# Patient Record
Sex: Male | Born: 1970 | Race: White | Hispanic: No | Marital: Single | Smoking: Current every day smoker
Health system: Southern US, Community
[De-identification: ages and names within clinical notes are randomized; demographics above are authoritative.]

## PROBLEM LIST (undated history)

## (undated) DIAGNOSIS — B1920 Unspecified viral hepatitis C without hepatic coma: Secondary | ICD-10-CM

## (undated) DIAGNOSIS — K219 Gastro-esophageal reflux disease without esophagitis: Secondary | ICD-10-CM

## (undated) DIAGNOSIS — F1910 Other psychoactive substance abuse, uncomplicated: Secondary | ICD-10-CM

## (undated) HISTORY — DX: Other psychoactive substance abuse, uncomplicated: F19.10

## (undated) HISTORY — DX: Unspecified viral hepatitis C without hepatic coma: B19.20

## (undated) HISTORY — PX: FRACTURE SURGERY: SHX138

## (undated) HISTORY — DX: Gastro-esophageal reflux disease without esophagitis: K21.9

---

## 2005-07-24 ENCOUNTER — Emergency Department (HOSPITAL_COMMUNITY): Admission: EM | Admit: 2005-07-24 | Discharge: 2005-07-24 | Payer: Self-pay | Admitting: Emergency Medicine

## 2015-04-24 ENCOUNTER — Encounter (HOSPITAL_COMMUNITY): Payer: Self-pay | Admitting: Emergency Medicine

## 2015-04-24 ENCOUNTER — Emergency Department (HOSPITAL_COMMUNITY)
Admission: EM | Admit: 2015-04-24 | Discharge: 2015-04-24 | Disposition: A | Discharge status: Home / Self Care | Payer: Self-pay | Attending: Emergency Medicine | Admitting: Emergency Medicine

## 2015-04-24 ENCOUNTER — Emergency Department (HOSPITAL_COMMUNITY): Payer: Self-pay

## 2015-04-24 DIAGNOSIS — Y9389 Activity, other specified: Secondary | ICD-10-CM | POA: Insufficient documentation

## 2015-04-24 DIAGNOSIS — S0083XA Contusion of other part of head, initial encounter: Secondary | ICD-10-CM | POA: Insufficient documentation

## 2015-04-24 DIAGNOSIS — Y998 Other external cause status: Secondary | ICD-10-CM | POA: Insufficient documentation

## 2015-04-24 DIAGNOSIS — Y9289 Other specified places as the place of occurrence of the external cause: Secondary | ICD-10-CM | POA: Insufficient documentation

## 2015-04-24 DIAGNOSIS — S0990XA Unspecified injury of head, initial encounter: Secondary | ICD-10-CM | POA: Insufficient documentation

## 2015-04-24 DIAGNOSIS — Z720 Tobacco use: Secondary | ICD-10-CM | POA: Insufficient documentation
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
## 2015-04-24 NOTE — ED Notes (Signed)
Pt got up to walk out of triage and is unsteady on feet
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
## 2015-04-24 NOTE — ED Notes (Signed)
MD Cook at bedside. 
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
## 2018-03-21 ENCOUNTER — Encounter (HOSPITAL_COMMUNITY): Payer: Self-pay | Admitting: Emergency Medicine

## 2018-03-21 ENCOUNTER — Emergency Department (HOSPITAL_COMMUNITY)
Admission: EM | Admit: 2018-03-21 | Discharge: 2018-03-21 | Disposition: A | Discharge status: Home / Self Care | Payer: Self-pay | Attending: Emergency Medicine | Admitting: Emergency Medicine

## 2018-03-21 ENCOUNTER — Other Ambulatory Visit: Payer: Self-pay

## 2018-03-21 DIAGNOSIS — Z5321 Procedure and treatment not carried out due to patient leaving prior to being seen by health care provider: Secondary | ICD-10-CM | POA: Insufficient documentation

## 2018-03-21 DIAGNOSIS — R2231 Localized swelling, mass and lump, right upper limb: Secondary | ICD-10-CM | POA: Insufficient documentation

## 2018-03-21 DIAGNOSIS — F1010 Alcohol abuse, uncomplicated: Secondary | ICD-10-CM | POA: Insufficient documentation

## 2018-03-21 NOTE — ED Triage Notes (Addendum)
Pt states drinking beer nonstop x4 days.  Pt states "I want to commit myself"  Denies SI/HI.  Pt punched the wall last night, swelling and pain to right hand

## 2018-03-21 NOTE — ED Notes (Signed)
Pt not in waiting area x 3 

## 2018-05-15 NOTE — Congregational Nurse Program (Signed)
Congregational Nurse Program Note  Date of Encounter: 05/15/2018  Past Medical History: No past medical history on file.  Encounter Details: CNP Questionnaire - 05/03/18 1025      Questionnaire   Patient Status  Not Applicable    Race  White or Caucasian    Location Patient Served At  [facility] [place]    Insurance  Not Applicable    Uninsured  Uninsured (NEW 1x/quarter)    Food  No food insecurities    Housing/Utilities  Yes, have permanent housing    Transportation  No transportation needs    Interpersonal Safety  Yes, feel physically and emotionally safe where you currently live    Medication  No medication insecurities    Medical Provider  No    Referrals  Not Applicable    ED Visit Averted  Not Applicable    Life-Saving Intervention Made  Not Applicable     Visited food pantry at the [facility] [address] Yuriko Portales RN, [place] [address], [phone]

## 2018-06-21 ENCOUNTER — Emergency Department (HOSPITAL_COMMUNITY): Payer: Self-pay

## 2018-06-21 ENCOUNTER — Emergency Department (HOSPITAL_COMMUNITY)
Admission: EM | Admit: 2018-06-21 | Discharge: 2018-06-22 | Disposition: A | Discharge status: Home / Self Care | Payer: Self-pay | Attending: Emergency Medicine | Admitting: Emergency Medicine

## 2018-06-21 ENCOUNTER — Other Ambulatory Visit: Payer: Self-pay

## 2018-06-21 ENCOUNTER — Encounter (HOSPITAL_COMMUNITY): Payer: Self-pay | Admitting: Emergency Medicine

## 2018-06-21 DIAGNOSIS — F1910 Other psychoactive substance abuse, uncomplicated: Secondary | ICD-10-CM

## 2018-06-21 DIAGNOSIS — F1410 Cocaine abuse, uncomplicated: Secondary | ICD-10-CM | POA: Insufficient documentation

## 2018-06-21 DIAGNOSIS — R0789 Other chest pain: Secondary | ICD-10-CM

## 2018-06-21 DIAGNOSIS — F10920 Alcohol use, unspecified with intoxication, uncomplicated: Secondary | ICD-10-CM

## 2018-06-21 DIAGNOSIS — F17210 Nicotine dependence, cigarettes, uncomplicated: Secondary | ICD-10-CM | POA: Insufficient documentation

## 2018-06-21 DIAGNOSIS — R55 Syncope and collapse: Secondary | ICD-10-CM

## 2018-06-21 DIAGNOSIS — F10220 Alcohol dependence with intoxication, uncomplicated: Secondary | ICD-10-CM | POA: Insufficient documentation
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
## 2018-06-22 ENCOUNTER — Other Ambulatory Visit: Payer: Self-pay

## 2018-06-22 LAB — I-STAT TROPONIN, ED
Troponin i, poc: 0 ng/mL (ref 0.00–0.08)
Troponin i, poc: 0 ng/mL (ref 0.00–0.08)
Troponin i, poc: 0 ng/mL (ref 0.00–0.08)

## 2018-06-22 LAB — CBC WITH DIFFERENTIAL/PLATELET
Basophils Absolute: 0 10*3/uL (ref 0.0–0.1)
Basophils Relative: 0 %
EOS ABS: 0.3 10*3/uL (ref 0.0–0.7)
EOS PCT: 4 %
HCT: 41.4 % (ref 39.0–52.0)
Hemoglobin: 14 g/dL (ref 13.0–17.0)
Lymphocytes Relative: 27 %
Lymphs Abs: 1.5 10*3/uL (ref 0.7–4.0)
MCH: 34.1 pg — AB (ref 26.0–34.0)
MCHC: 33.8 g/dL (ref 30.0–36.0)
MCV: 100.7 fL — ABNORMAL HIGH (ref 78.0–100.0)
MONOS PCT: 11 %
Monocytes Absolute: 0.6 10*3/uL (ref 0.1–1.0)
Neutro Abs: 3.2 10*3/uL (ref 1.7–7.7)
Neutrophils Relative %: 58 %
PLATELETS: 155 10*3/uL (ref 150–400)
RBC: 4.11 MIL/uL — ABNORMAL LOW (ref 4.22–5.81)
RDW: 14.5 % (ref 11.5–15.5)
WBC: 5.6 10*3/uL (ref 4.0–10.5)

## 2018-06-22 LAB — COMPREHENSIVE METABOLIC PANEL
ALT: 34 U/L (ref 0–44)
ANION GAP: 11 (ref 5–15)
AST: 40 U/L (ref 15–41)
Albumin: 3.6 g/dL (ref 3.5–5.0)
Alkaline Phosphatase: 65 U/L (ref 38–126)
BUN: 12 mg/dL (ref 6–20)
CO2: 23 mmol/L (ref 22–32)
Calcium: 8.6 mg/dL — ABNORMAL LOW (ref 8.9–10.3)
Chloride: 105 mmol/L (ref 98–111)
Creatinine, Ser: 0.92 mg/dL (ref 0.61–1.24)
GFR calc non Af Amer: >60 mL/min (ref >60)
Glucose, Bld: 122 mg/dL — ABNORMAL HIGH (ref 70–99)
Potassium: 3.3 mmol/L — ABNORMAL LOW (ref 3.5–5.1)
SODIUM: 139 mmol/L (ref 135–145)
Total Bilirubin: 0.8 mg/dL (ref 0.3–1.2)
Total Protein: 6.8 g/dL (ref 6.5–8.1)

## 2018-06-22 LAB — RAPID URINE DRUG SCREEN, HOSP PERFORMED
Amphetamines: NOT DETECTED
BARBITURATES: NOT DETECTED
BENZODIAZEPINES: NOT DETECTED
COCAINE: POSITIVE — AB
OPIATES: NOT DETECTED
TETRAHYDROCANNABINOL: POSITIVE — AB

## 2018-06-22 LAB — ETHANOL: ALCOHOL ETHYL (B): 137 mg/dL — AB (ref <10)

## 2018-06-22 MED ORDER — ASPIRIN 81 MG PO CHEW
324.0000 mg | CHEWABLE_TABLET | Freq: Once | ORAL | Status: AC
  Administered 2018-06-22: 324 mg via ORAL
  Filled 2018-06-22: qty 4

## 2018-06-22 MED ORDER — NITROGLYCERIN 2 % TD OINT
1.0000 [in_us] | TOPICAL_OINTMENT | Freq: Once | TRANSDERMAL | Status: AC
  Administered 2018-06-22: 1 [in_us] via TOPICAL
  Filled 2018-06-22: qty 1

## 2018-06-22 NOTE — Discharge Instructions (Addendum)
He was seen today for chest pain and syncope.  Your work-up is largely reassuring.  Your heart testing is negative.  You need to stop smoking.  You should also discontinue cocaine use as this puts you at high risk.  Follow-up with cardiology for stress testing.  If you develop recurrent pain, any new or worsening symptoms you should be reevaluated.
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
## 2018-06-22 NOTE — ED Notes (Signed)
Pt ambulatory to waiting room. Pt verbalized understanding of discharge instructions.   

## 2019-02-28 ENCOUNTER — Other Ambulatory Visit: Payer: Self-pay

## 2019-02-28 ENCOUNTER — Ambulatory Visit: Payer: Self-pay | Admitting: Physician Assistant

## 2019-02-28 ENCOUNTER — Encounter: Payer: Self-pay | Admitting: Physician Assistant

## 2019-02-28 VITALS — BP 106/66 | HR 87 | Temp 98.2°F

## 2019-02-28 DIAGNOSIS — F17200 Nicotine dependence, unspecified, uncomplicated: Secondary | ICD-10-CM | POA: Insufficient documentation

## 2019-02-28 DIAGNOSIS — Z7689 Persons encountering health services in other specified circumstances: Secondary | ICD-10-CM

## 2019-02-28 DIAGNOSIS — F1911 Other psychoactive substance abuse, in remission: Secondary | ICD-10-CM

## 2019-02-28 DIAGNOSIS — Z8619 Personal history of other infectious and parasitic diseases: Secondary | ICD-10-CM

## 2019-02-28 DIAGNOSIS — K219 Gastro-esophageal reflux disease without esophagitis: Secondary | ICD-10-CM | POA: Insufficient documentation

## 2019-02-28 MED ORDER — OMEPRAZOLE 40 MG PO CPDR: 40.0000 mg | DELAYED_RELEASE_CAPSULE | Freq: Every day | ORAL | 3 refills | Status: AC

## 2019-02-28 MED ORDER — OMEPRAZOLE 40 MG PO CPDR: 40.0000 mg | DELAYED_RELEASE_CAPSULE | Freq: Every day | ORAL | 3 refills | Status: DC

## 2019-02-28 NOTE — Patient Instructions (Signed)
For [facility] please bring 1040 tax forms for 2019

## 2019-02-28 NOTE — Progress Notes (Signed)
BP 106/66   Pulse 87   Temp 98.2 F (36.8 C)   SpO2 97%    Subjective:    Patient ID: Brendan Cruz, male    DOB: 06-Oct-1971, 48 y.o.   MRN: [id number]  HPI: Brendan GivenRoger L Cruz is a 48 y.o. male presenting on 02/28/2019 for New Patient (Initial Visit) and Gastroesophageal Reflux   HPI   Chief Complaint  Patient presents with  . New Patient (Initial Visit)  . Gastroesophageal Reflux     [facility] .  He says it is going well so far.   [facility] [employer] [facility] [employer] [facility]   Pt just got out of jail- April 9-    He says it was Probation vialation  (he went in in august)  He says 2- 20mg  omeprazole work for his GERD.    Pt was told in jail that he had hep C.  He has no complaints today.     Relevant past medical, surgical, family and social history reviewed and updated as indicated. Interim medical history since our last visit reviewed. Allergies and medications reviewed and updated.   Current Outpatient Medications:  .  omeprazole (PRILOSEC) 20 MG capsule, Take 20 mg by mouth 2 (two) times a day., Disp: , Rfl:     Review of Systems  Per HPI unless specifically indicated above     Objective:    BP 106/66   Pulse 87   Temp 98.2 F (36.8 C)   SpO2 97%   Wt Readings from Last 3 Encounters:  03/21/18 157 lb 7 oz (71.4 kg)  04/24/15 165 lb (74.8 kg)    Physical Exam Vitals signs reviewed.  Constitutional:      Appearance: He is well-developed.  HENT:     Head: Normocephalic and atraumatic.     Mouth/Throat:     Pharynx: No oropharyngeal exudate.  Eyes:     Conjunctiva/sclera: Conjunctivae normal.     Pupils: Pupils are equal, round, and reactive to light.  Neck:     Musculoskeletal: Neck supple.     Thyroid: No thyromegaly.  Cardiovascular:     Rate and Rhythm: Normal rate and regular rhythm.  Pulmonary:     Effort: Pulmonary effort is normal.     Breath sounds: Normal breath sounds. No wheezing or rales.  Abdominal:     General: Bowel  sounds are normal.     Palpations: Abdomen is soft. There is no mass.     Tenderness: There is no abdominal tenderness.  Lymphadenopathy:     Cervical: No cervical adenopathy.  Skin:    General: Skin is warm and dry.     Findings: No rash.  Neurological:     Mental Status: He is alert and oriented to person, place, and time.  Psychiatric:        Behavior: Behavior normal.        Thought Content: Thought content normal.          Assessment & Plan:    Encounter Diagnoses  Name Primary?  . Encounter to establish care Yes  . Gastroesophageal reflux disease, esophagitis presence not specified   . Substance abuse in remission (HCC)   . History of hepatitis C   . Tobacco use disorder    -will defer labs at this time due to CV19.  -pt is signed up for [facility] to get his omeprazole.   -pt to follow up in 3 months.  Will check for Hep  C at that time.  Pt to contact office sooner prn

## 2019-05-29 ENCOUNTER — Other Ambulatory Visit: Payer: Self-pay | Admitting: Physician Assistant

## 2019-05-29 DIAGNOSIS — Z8619 Personal history of other infectious and parasitic diseases: Secondary | ICD-10-CM

## 2019-05-29 DIAGNOSIS — Z13220 Encounter for screening for lipoid disorders: Secondary | ICD-10-CM

## 2019-05-29 DIAGNOSIS — Z125 Encounter for screening for malignant neoplasm of prostate: Secondary | ICD-10-CM

## 2019-05-30 ENCOUNTER — Ambulatory Visit: Payer: Self-pay | Admitting: Physician Assistant

## 2019-06-06 ENCOUNTER — Ambulatory Visit: Payer: Self-pay | Admitting: Physician Assistant

## 2019-06-06 ENCOUNTER — Encounter: Payer: Self-pay | Admitting: Physician Assistant

## 2019-06-06 DIAGNOSIS — F17200 Nicotine dependence, unspecified, uncomplicated: Secondary | ICD-10-CM

## 2019-06-06 DIAGNOSIS — K219 Gastro-esophageal reflux disease without esophagitis: Secondary | ICD-10-CM

## 2019-06-06 NOTE — Progress Notes (Signed)
   There were no vitals taken for this visit.   Subjective:    Patient ID: Brendan Cruz, male    DOB: 1971/10/11, 48 y.o.   MRN: [id number]  HPI: [name] is a 49 y.o. male presenting on 06/06/2019 for No chief complaint on file.   HPI   This is a telemedicine visit due to the coronavirus pandemic.  It is via telephone due to pt doesn't have a phone with video capabilities.  I connected with  Brendan Cruz on 06/06/19 by a video enabled telemedicine application and verified that I am speaking with the correct person using two identifiers.   I discussed the limitations of evaluation and management by telemedicine. The patient expressed understanding and agreed to proceed.   He is at work remodeling a house.   Provider is at office   Pt says he moved recently.  Address is updated in demographics  He didn't get his labs done.  He says he has just been busy lately.  He says he is working on remodeling a home and has time constraints  He is stressed- trying to get a house finished.    He is no longer going to [facility] He was only at [facility] for 1 1/2 months.  He says he got bored.  He is drinking some.  No drugs.  He drank a 24oz bud 2 nights ago.  He says that the the only time he drank since leaving [facility] house.     He is still smoking.   Pt has no complaints today.     Relevant past medical, surgical, family and social history reviewed and updated as indicated. Interim medical history since our last visit reviewed. Allergies and medications reviewed and updated.   Current Outpatient Medications:  .  omeprazole (PRILOSEC) 40 MG capsule, Take 1 capsule (40 mg total) by mouth daily., Disp: 90 capsule, Rfl: 3    Review of Systems  Per HPI unless specifically indicated above     Objective:    There were no vitals taken for this visit.  Wt Readings from Last 3 Encounters:  03/21/18 157 lb 7 oz (71.4 kg)  04/24/15 165 lb (74.8 kg)    Physical Exam Pulmonary:    Effort: No respiratory distress.  Neurological:     Mental Status: He is alert and oriented to person, place, and time.  Psychiatric:        Attention and Perception: Attention normal.        Mood and Affect: Mood normal.        Speech: Speech normal.        Behavior: Behavior is cooperative.          Assessment & Plan:    Encounter Diagnoses  Name Primary?  . Gastroesophageal reflux disease, esophagitis presence not specified Yes  . Tobacco use disorder       -Pt to get fasting labs drawn.  He will be called with results -pt to continue omeprazole for GERD -pt has information on AA and similar programs if he feels that he needs them -pt to follow up here 6 months.  He is to contact office sooner prn

## 2019-08-31 IMAGING — DX DG CHEST 2V
2 series · 2 of 2 positions shown · non-contrast
Comparison: None.

CLINICAL DATA: Chest pain

EXAM:
CHEST - 2 VIEW

[chest lat]
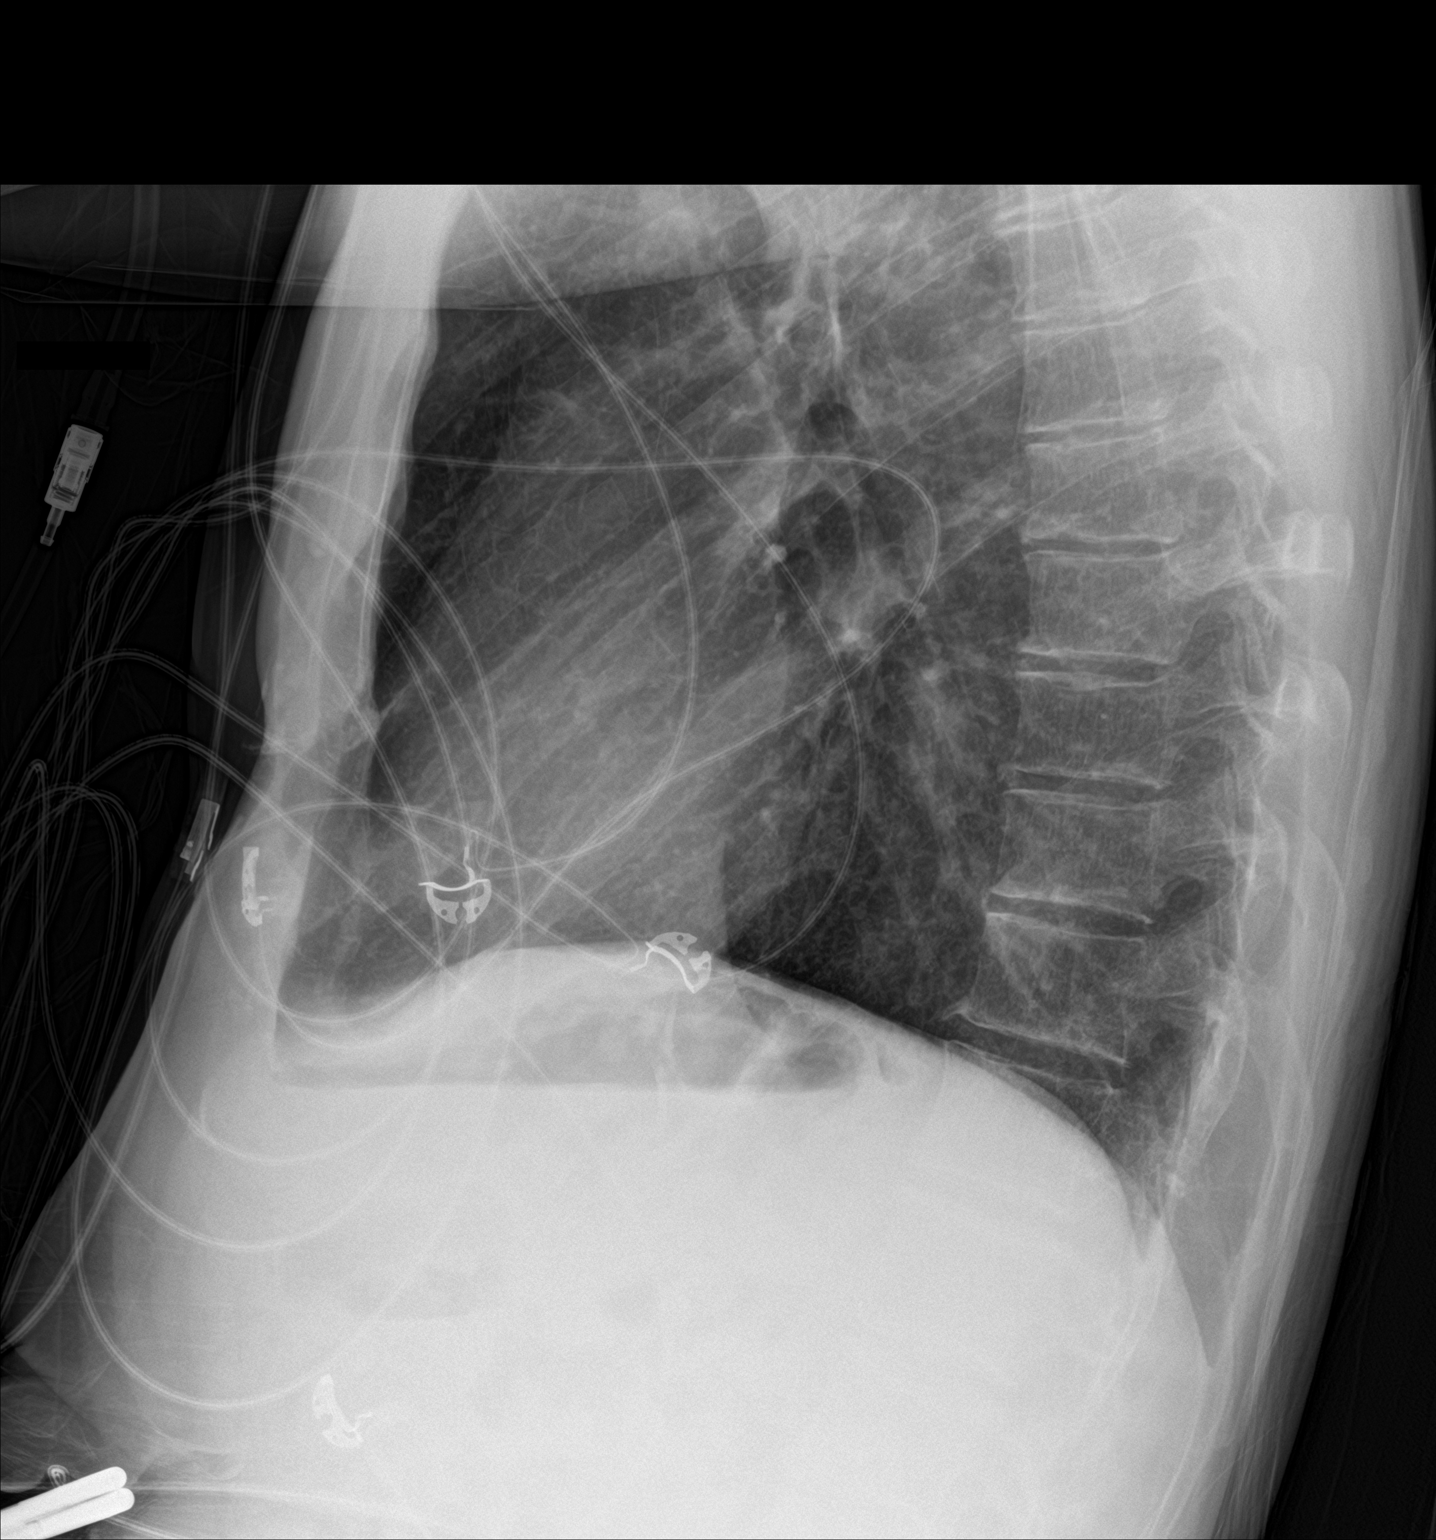

[chest ap]
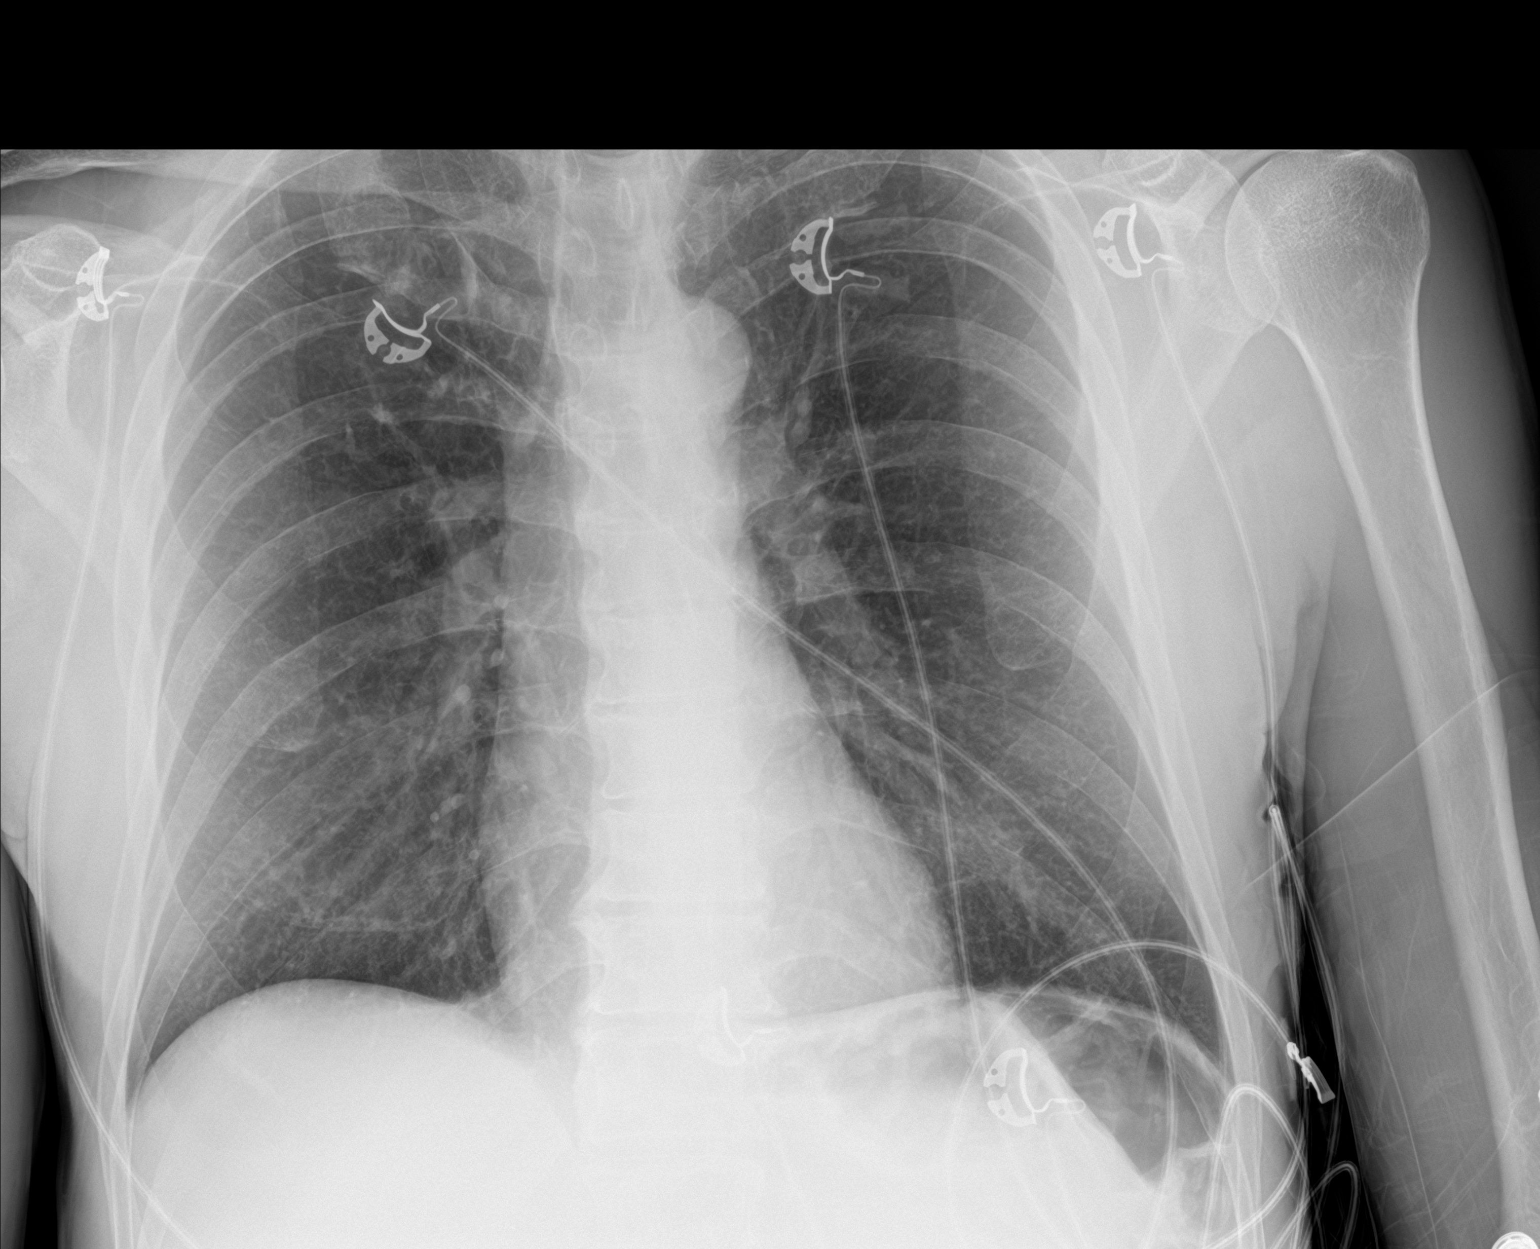

[2 of 2 positions shown; findings below may reference images not displayed]

FINDINGS: The heart size and mediastinal contours are within normal limits.
Both lungs are clear. The visualized skeletal structures are
unremarkable.
IMPRESSION: No active cardiopulmonary disease.
